# Patient Record
Sex: Male | Born: 2000 | Race: White | Hispanic: No | Marital: Single | Smoking: Never smoker
Health system: Southern US, Community
[De-identification: ages and names within clinical notes are randomized; demographics above are authoritative.]

---
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
## 2020-05-09 ENCOUNTER — Other Ambulatory Visit: Payer: Self-pay

## 2020-05-09 ENCOUNTER — Emergency Department
Admission: EM | Admit: 2020-05-09 | Discharge: 2020-05-09 | Disposition: A | Discharge status: Home / Self Care | Payer: Medicaid Other | Attending: Emergency Medicine | Admitting: Emergency Medicine

## 2020-05-09 ENCOUNTER — Encounter: Payer: Self-pay | Admitting: Emergency Medicine

## 2020-05-09 ENCOUNTER — Emergency Department: Payer: Medicaid Other

## 2020-05-09 DIAGNOSIS — N503 Cyst of epididymis: Secondary | ICD-10-CM | POA: Diagnosis not present

## 2020-05-09 DIAGNOSIS — N5089 Other specified disorders of the male genital organs: Secondary | ICD-10-CM | POA: Diagnosis present

## 2020-05-09 LAB — URINALYSIS, COMPLETE (UACMP) WITH MICROSCOPIC
Bilirubin Urine: NEGATIVE
Glucose, UA: NEGATIVE mg/dL
Hgb urine dipstick: NEGATIVE
Ketones, ur: NEGATIVE mg/dL
Leukocytes,Ua: NEGATIVE
Nitrite: NEGATIVE
Protein, ur: NEGATIVE mg/dL
Specific Gravity, Urine: 1.021 (ref 1.005–1.030)
Squamous Epithelial / LPF: NONE SEEN (ref 0–5)
pH: 6 (ref 5.0–8.0)

## 2020-05-09 NOTE — ED Notes (Signed)
Pt in ultrasound

## 2020-05-09 NOTE — ED Triage Notes (Signed)
Pt to ED via POV with c/o knot to scrotum. Pt states moves when palpated, pt also states has been there for over 1 year.

## 2020-05-09 NOTE — Discharge Instructions (Signed)
Return to the emergency department if you get sudden onset of testicular pain or swelling. Otherwise, follow up with primary care as needed.

## 2020-05-10 NOTE — ED Provider Notes (Signed)
[facility] Emergency Department Provider Note  ____________________________________________   Event Date/Time   First MD Initiated Contact with Patient 05/09/20 1544     (approximate)  I have reviewed the triage vital signs and the nursing notes.   HISTORY  Chief Complaint Knot on Scrotum  HPI Sean Zhang is a 19 y.o. male who presents to the emergency department for evaluation of "knot" on his right testicle.  Patient states that it has been there for approximately 1 year.  The patient has not had it previously evaluated.  He describes the mass as mobile, reports to have not changed in size or shape over the last year.  He denies any skin changes, redness warmth or heat to the region.  He denies any pain in the scrotum.  He denies any dysuria, frequency, urethral discharge, abdominal pain.  States that he decided to finally get it evaluated today to to reading online the possible diagnoses and he became concerned.  He denies any other symptoms.         History reviewed. No pertinent past medical history.  There are no problems to display for this patient.   Past Surgical History:  Procedure Laterality Date   MOLE REMOVAL  06/06/2016    Prior to Admission medications   Medication Sig Start Date End Date Taking? Authorizing Provider  albuterol (PROVENTIL HFA;VENTOLIN HFA) 108 (90 Base) MCG/ACT inhaler Inhale 2 puffs into the lungs every 4 (four) hours as needed for wheezing or shortness of breath. 03/31/18   Cuthriell, Delorise Royals, PA-C  ibuprofen (ADVIL,MOTRIN) 800 MG tablet Take 1 tablet (800 mg total) by mouth every 8 (eight) hours as needed for moderate pain. Patient not taking: Reported on 09/21/2016 06/21/16   Irean Hong, MD  predniSONE (DELTASONE) 50 MG tablet Take 1 tablet (50 mg total) by mouth daily with breakfast. 03/31/18   Cuthriell, Delorise Royals, PA-C    Allergies Patient has no known allergies.  History reviewed. No pertinent family  history.  Social History Social History   Tobacco Use   Smoking status: Never Smoker   Smokeless tobacco: Never Used  Substance Use Topics   Alcohol use: No   Drug use: No    Review of Systems Constitutional: No fever/chills Eyes: No visual changes. ENT: No sore throat. Cardiovascular: Denies chest pain. Respiratory: Denies shortness of breath. Gastrointestinal: No abdominal pain.  No nausea, no vomiting.  No diarrhea.  No constipation. Genitourinary: + Scrotal mass, negative for dysuria. Musculoskeletal: Negative for back pain. Skin: Negative for rash. Neurological: Negative for headaches, focal weakness or numbness.   ____________________________________________   PHYSICAL EXAM:  VITAL SIGNS: ED Triage Vitals  Enc Vitals Group     BP 05/09/20 1250 133/66     Pulse Rate 05/09/20 1250 91     Resp 05/09/20 1250 20     Temp 05/09/20 1250 98.7 F (37.1 C)     Temp Source 05/09/20 1250 Oral     SpO2 05/09/20 1250 100 %     Weight 05/09/20 1251 130 lb (59 kg)     Height 05/09/20 1251 5\' 6"  (1.676 m)     Head Circumference --      Peak Flow --      Pain Score 05/09/20 1251 0     Pain Loc --      Pain Edu? --      Excl. in GC? --     Constitutional: Alert and oriented. Well appearing and in no  acute distress. Eyes: Conjunctivae are normal.  Head: Atraumatic. Nose: No congestion/rhinnorhea. Mouth/Throat: Mucous membranes are moist.   Neck: No stridor.   Cardiovascular: Normal rate, regular rhythm. Grossly normal heart sounds.  Good peripheral circulation. Respiratory: Normal respiratory effort.  No retractions. Lungs CTAB. Gastrointestinal: Soft and nontender. No distention. No abdominal bruits. No CVA tenderness. Genitourinary: Palpation of the right testicle does seem slightly enlarged compared to the left.  Distinct mass not clearly palpable.  There is no erythema, swelling or increased warmth.  No skin changes. Musculoskeletal: No lower extremity  tenderness nor edema.  No joint effusions. Neurologic:  Normal speech and language. No gross focal neurologic deficits are appreciated. No gait instability. Skin:  Skin is warm, dry and intact. No rash noted. Psychiatric: Mood and affect are normal. Speech and behavior are normal.  ____________________________________________   LABS (all labs ordered are listed, but only abnormal results are displayed)  Labs Reviewed  URINALYSIS, COMPLETE (UACMP) WITH MICROSCOPIC - Abnormal; Notable for the following components:      Result Value   Color, Urine YELLOW (*)    APPearance HAZY (*)    Bacteria, UA RARE (*)    All other components within normal limits   ____________________________________________  RADIOLOGY  Official radiology report(s): US SCROTUM W/DOPPLER  Result Date: 05/09/2020 CLINICAL DATA:  Scrotal mass x 1 year EXAM: SCROTAL ULTRASOUND DOPPLER ULTRASOUND OF THE TESTICLES TECHNIQUE: Complete ultrasound examination of the testicles, epididymis, and other scrotal structures was performed. Color and spectral Doppler ultrasound were also utilized to evaluate blood flow to the testicles. COMPARISON:  None. FINDINGS: Right testicle Measurements: 4 x 2 x 2.7 cm.  No mass or microlithiasis visualized. Left testicle Measurements: 3.9 x 1.8 x 2.2 cm. No mass or microlithiasis visualized. Right epididymis: Normal in size measuring 0.9 x 0.9 x 1.3 cm. Normal in appearance. Left epididymis: Normal in size measuring 1 x 0.9 x 1.5 cm. There is a 1.1 x 1 x 0.9 cm at pitted multiple cysts. Hydrocele:  None visualized. Varicocele:  None visualized. Pulsed Doppler interrogation of both testes demonstrates normal low resistance arterial and venous waveforms bilaterally. IMPRESSION: 1. A 1.1 x 1 x 0.9 cm left epididymal tail cyst. 2. Otherwise unremarkable scrotal ultrasound. Electronically Signed   By: Tish Frederickson M.D.   On: 05/09/2020 17:12   _________________________________________   INITIAL  IMPRESSION / ASSESSMENT AND PLAN / ED COURSE  As part of my medical decision making, I reviewed the following data within the electronic MEDICAL RECORD NUMBER Nursing notes reviewed and incorporated        Patient is a 19 year old male who presents to the emergency department for evaluation of a reported testicular mass on the right side has been present for approximately a year.  He states that other than noticing is present, he denies any other symptoms related such as pain, swelling, dysuria.  He reports no concern for exposure to STD.  Physical exam does demonstrate a slightly enlarged right testicle as compared to the left without any distinct palpable mass.  Otherwise normal external male genitalia.  Ultrasound was performed of the scrotum to assess the expressed concern.  Ultrasound does demonstrate a 1.1 x 1 x 0.9 cm epididymal tail cyst on the LEFT but is otherwise unremarkable.  Blood flow appropriate to both.  Advised the patient on the nature of epididymal cysts, and that they are typically benign.  However instructed the patient to follow-up with primary care and/or urology if he has ongoing concern.  Advised patient to return to the emergency department if he develops any sudden onset of testicular pain or swelling.  Patient is amenable with this plan and will follow up as appropriate.      ____________________________________________   FINAL CLINICAL IMPRESSION(S) / ED DIAGNOSES  Final diagnoses:  Epididymal cyst     ED Discharge Orders    None      *Please note:  Sean E Casco was evaluated in Emergency Department on 05/10/2020 for the symptoms described in the history of present illness. He was evaluated in the context of the global COVID-19 pandemic, which necessitated consideration that the patient might be at risk for infection with the SARS-CoV-2 virus that causes COVID-19. Institutional protocols and algorithms that pertain to the evaluation of patients at risk for COVID-19  are in a state of rapid change based on information released by regulatory bodies including the CDC and federal and state organizations. These policies and algorithms were followed during the patient's care in the ED.  Some ED evaluations and interventions may be delayed as a result of limited staffing during and the pandemic.*   Note:  This document was prepared using Dragon voice recognition software and may include unintentional dictation errors.    Lucy Chris, PA 05/10/20 1456    Sharyn Creamer, MD 05/10/20 2300

## 2021-05-10 DIAGNOSIS — H5213 Myopia, bilateral: Secondary | ICD-10-CM | POA: Diagnosis not present
